# Patient Record
Sex: Female | Born: 1970 | Race: Black or African American | Hispanic: No | Marital: Married | Smoking: Current every day smoker
Health system: Southern US, Community
[De-identification: ages and names within clinical notes are randomized; demographics above are authoritative.]

## PROBLEM LIST (undated history)

## (undated) DIAGNOSIS — I10 Essential (primary) hypertension: Secondary | ICD-10-CM

---

## 2001-01-03 ENCOUNTER — Emergency Department (HOSPITAL_COMMUNITY): Admission: EM | Admit: 2001-01-03 | Discharge: 2001-01-03 | Payer: Self-pay | Admitting: Emergency Medicine

## 2001-02-07 ENCOUNTER — Emergency Department (HOSPITAL_COMMUNITY): Admission: EM | Admit: 2001-02-07 | Discharge: 2001-02-07 | Payer: Self-pay

## 2005-12-01 ENCOUNTER — Emergency Department: Payer: Self-pay | Admitting: Emergency Medicine

## 2006-05-14 ENCOUNTER — Emergency Department: Payer: Self-pay | Admitting: Emergency Medicine

## 2007-01-03 ENCOUNTER — Ambulatory Visit: Payer: Self-pay

## 2007-01-16 ENCOUNTER — Encounter: Payer: Self-pay | Admitting: Maternal & Fetal Medicine

## 2007-02-10 ENCOUNTER — Observation Stay: Payer: Self-pay

## 2007-02-12 ENCOUNTER — Inpatient Hospital Stay: Payer: Self-pay | Admitting: Obstetrics & Gynecology

## 2013-07-03 ENCOUNTER — Ambulatory Visit: Payer: Self-pay | Admitting: *Deleted

## 2013-07-09 ENCOUNTER — Ambulatory Visit: Payer: Self-pay | Admitting: Orthopedic Surgery

## 2014-07-04 ENCOUNTER — Ambulatory Visit: Payer: Self-pay | Admitting: Family Medicine

## 2015-04-10 ENCOUNTER — Emergency Department
Admission: EM | Admit: 2015-04-10 | Discharge: 2015-04-11 | Disposition: A | Discharge status: Home / Self Care | Payer: PRIVATE HEALTH INSURANCE | Attending: Student | Admitting: Student

## 2015-04-10 ENCOUNTER — Emergency Department: Payer: PRIVATE HEALTH INSURANCE

## 2015-04-10 ENCOUNTER — Encounter: Payer: Self-pay | Admitting: *Deleted

## 2015-04-10 DIAGNOSIS — S40011A Contusion of right shoulder, initial encounter: Secondary | ICD-10-CM

## 2015-04-10 DIAGNOSIS — Y92410 Unspecified street and highway as the place of occurrence of the external cause: Secondary | ICD-10-CM | POA: Insufficient documentation

## 2015-04-10 DIAGNOSIS — Z720 Tobacco use: Secondary | ICD-10-CM | POA: Insufficient documentation

## 2015-04-10 DIAGNOSIS — Y9389 Activity, other specified: Secondary | ICD-10-CM | POA: Diagnosis not present

## 2015-04-10 DIAGNOSIS — Y998 Other external cause status: Secondary | ICD-10-CM | POA: Diagnosis not present

## 2015-04-10 DIAGNOSIS — S4991XA Unspecified injury of right shoulder and upper arm, initial encounter: Secondary | ICD-10-CM | POA: Diagnosis present

## 2015-04-10 MED ORDER — HYDROCODONE-ACETAMINOPHEN 5-325 MG PO TABS: 1.0000 | ORAL_TABLET | ORAL | Status: DC | PRN

## 2015-04-10 MED ORDER — HYDROCODONE-ACETAMINOPHEN 5-325 MG PO TABS
1.0000 | ORAL_TABLET | Freq: Once | ORAL | Status: AC
  Administered 2015-04-11: 1 via ORAL
  Filled 2015-04-10: qty 1
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
## 2015-04-10 NOTE — Discharge Instructions (Signed)
Contusion °A contusion is a deep bruise. Contusions happen when an injury causes bleeding under the skin. Signs of bruising include pain, puffiness (swelling), and discolored skin. The contusion may turn blue, purple, or yellow. °HOME CARE  °· Put ice on the injured area. °¨ Put ice in a plastic bag. °¨ Place a towel between your skin and the bag. °¨ Leave the ice on for 15-20 minutes, 03-04 times a day. °· Only take medicine as told by your doctor. °· Rest the injured area. °· If possible, raise (elevate) the injured area to lessen puffiness. °GET HELP RIGHT AWAY IF:  °· You have more bruising or puffiness. °· You have pain that is getting worse. °· Your puffiness or pain is not helped by medicine. °MAKE SURE YOU:  °· Understand these instructions. °· Will watch your condition. °· Will get help right away if you are not doing well or get worse. °Document Released: 12/29/2007 Document Revised: 10/04/2011 Document Reviewed: 05/17/2011 °Exit[employer]® Patient Information ©2015 [employer]. This information is not intended to replace advice given to you by your health care provider. Make sure you discuss any questions you have with your health care provider. ° °
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
## 2015-04-11 DIAGNOSIS — S40011A Contusion of right shoulder, initial encounter: Secondary | ICD-10-CM | POA: Diagnosis not present

## 2016-05-05 ENCOUNTER — Other Ambulatory Visit: Payer: Self-pay | Admitting: Physician Assistant

## 2016-05-05 DIAGNOSIS — Z1231 Encounter for screening mammogram for malignant neoplasm of breast: Secondary | ICD-10-CM

## 2016-05-07 ENCOUNTER — Ambulatory Visit
Admission: RE | Admit: 2016-05-07 | Discharge: 2016-05-07 | Disposition: A | Discharge status: Home / Self Care | Payer: PRIVATE HEALTH INSURANCE | Source: Ambulatory Visit | Attending: Physician Assistant | Admitting: Physician Assistant

## 2016-05-07 DIAGNOSIS — Z1231 Encounter for screening mammogram for malignant neoplasm of breast: Secondary | ICD-10-CM | POA: Diagnosis not present

## 2017-05-08 ENCOUNTER — Other Ambulatory Visit: Payer: Self-pay

## 2017-05-08 DIAGNOSIS — Z1231 Encounter for screening mammogram for malignant neoplasm of breast: Secondary | ICD-10-CM

## 2017-05-11 ENCOUNTER — Ambulatory Visit
Admission: RE | Admit: 2017-05-11 | Discharge: 2017-05-11 | Disposition: A | Discharge status: Home / Self Care | Payer: PRIVATE HEALTH INSURANCE | Source: Ambulatory Visit | Attending: Physician Assistant | Admitting: Physician Assistant

## 2017-05-11 DIAGNOSIS — R928 Other abnormal and inconclusive findings on diagnostic imaging of breast: Secondary | ICD-10-CM | POA: Insufficient documentation

## 2017-05-11 DIAGNOSIS — Z1231 Encounter for screening mammogram for malignant neoplasm of breast: Secondary | ICD-10-CM | POA: Diagnosis present

## 2017-05-13 ENCOUNTER — Other Ambulatory Visit: Payer: Self-pay | Admitting: Physician Assistant

## 2017-05-13 DIAGNOSIS — N6489 Other specified disorders of breast: Secondary | ICD-10-CM

## 2017-05-13 DIAGNOSIS — R928 Other abnormal and inconclusive findings on diagnostic imaging of breast: Secondary | ICD-10-CM

## 2017-05-23 ENCOUNTER — Ambulatory Visit
Admission: RE | Admit: 2017-05-23 | Discharge: 2017-05-23 | Disposition: A | Discharge status: Home / Self Care | Payer: PRIVATE HEALTH INSURANCE | Source: Ambulatory Visit | Attending: Physician Assistant | Admitting: Physician Assistant

## 2017-05-23 DIAGNOSIS — N6489 Other specified disorders of breast: Secondary | ICD-10-CM | POA: Diagnosis not present

## 2017-05-23 DIAGNOSIS — R928 Other abnormal and inconclusive findings on diagnostic imaging of breast: Secondary | ICD-10-CM

## 2018-05-11 ENCOUNTER — Other Ambulatory Visit: Payer: Self-pay | Admitting: Physician Assistant

## 2018-05-11 DIAGNOSIS — Z1231 Encounter for screening mammogram for malignant neoplasm of breast: Secondary | ICD-10-CM

## 2018-05-16 ENCOUNTER — Ambulatory Visit: Payer: PRIVATE HEALTH INSURANCE | Attending: Physician Assistant

## 2019-05-01 ENCOUNTER — Ambulatory Visit
Admission: RE | Admit: 2019-05-01 | Discharge: 2019-05-01 | Disposition: A | Discharge status: Home / Self Care | Payer: PRIVATE HEALTH INSURANCE | Source: Ambulatory Visit | Attending: Physician Assistant | Admitting: Physician Assistant

## 2019-05-01 DIAGNOSIS — Z1231 Encounter for screening mammogram for malignant neoplasm of breast: Secondary | ICD-10-CM | POA: Insufficient documentation

## 2019-10-12 ENCOUNTER — Ambulatory Visit: Payer: PRIVATE HEALTH INSURANCE | Attending: Internal Medicine

## 2019-10-12 DIAGNOSIS — Z23 Encounter for immunization: Secondary | ICD-10-CM

## 2019-10-12 NOTE — Progress Notes (Signed)
   Covid-19 Vaccination Clinic  Name:  Kristina Salas    MRN: [id number] DOB: 1971-06-16  10/12/2019  [name] was observed post Covid-19 immunization for 15 minutes without incident. She was provided with Vaccine Information Sheet and instruction to access the V-Safe system.   [name] was instructed to call 911 with any severe reactions post vaccine: Marland Kitchen Difficulty breathing  . Swelling of face and throat  . A fast heartbeat  . A bad rash all over body  . Dizziness and weakness   Immunizations Administered    Name Date Dose VIS Date Route   Pfizer COVID-19 Vaccine 10/12/2019 11:01 AM 0.3 mL 07/06/2019 Intramuscular   Manufacturer: [employer], [employer]   Lot: [id number]   NDC: [address]-4

## 2019-11-06 ENCOUNTER — Ambulatory Visit: Payer: PRIVATE HEALTH INSURANCE | Attending: Internal Medicine

## 2019-11-06 DIAGNOSIS — Z23 Encounter for immunization: Secondary | ICD-10-CM

## 2019-11-06 NOTE — Progress Notes (Signed)
   Covid-19 Vaccination Clinic  Name:  ZARYA LASSEIGNE    MRN: [id number] DOB: 1970/09/04  11/06/2019  Ms. Inlow was observed post Covid-19 immunization for 15 minutes without incident. She was provided with Vaccine Information Sheet and instruction to access the V-Safe system.   Ms. Crisanti was instructed to call 911 with any severe reactions post vaccine: Marland Kitchen Difficulty breathing  . Swelling of face and throat  . A fast heartbeat  . A bad rash all over body  . Dizziness and weakness   Immunizations Administered    Name Date Dose VIS Date Route   Pfizer COVID-19 Vaccine 11/06/2019  2:12 PM 0.3 mL 07/06/2019 Intramuscular   Manufacturer: [employer], [employer]   Lot: [id number]   NDC: [address]-7

## 2020-01-21 ENCOUNTER — Emergency Department: Payer: BC Managed Care – PPO

## 2020-01-21 ENCOUNTER — Encounter: Payer: Self-pay | Admitting: Emergency Medicine

## 2020-01-21 ENCOUNTER — Other Ambulatory Visit: Payer: Self-pay

## 2020-01-21 ENCOUNTER — Emergency Department
Admission: EM | Admit: 2020-01-21 | Discharge: 2020-01-21 | Disposition: A | Discharge status: Home / Self Care | Payer: BC Managed Care – PPO | Attending: Emergency Medicine | Admitting: Emergency Medicine

## 2020-01-21 DIAGNOSIS — M7918 Myalgia, other site: Secondary | ICD-10-CM

## 2020-01-21 DIAGNOSIS — F17200 Nicotine dependence, unspecified, uncomplicated: Secondary | ICD-10-CM | POA: Insufficient documentation

## 2020-01-21 DIAGNOSIS — R52 Pain, unspecified: Secondary | ICD-10-CM

## 2020-01-21 DIAGNOSIS — S40812A Abrasion of left upper arm, initial encounter: Secondary | ICD-10-CM | POA: Diagnosis not present

## 2020-01-21 DIAGNOSIS — S0081XA Abrasion of other part of head, initial encounter: Secondary | ICD-10-CM | POA: Diagnosis not present

## 2020-01-21 DIAGNOSIS — T148XXA Other injury of unspecified body region, initial encounter: Secondary | ICD-10-CM

## 2020-01-21 DIAGNOSIS — Y999 Unspecified external cause status: Secondary | ICD-10-CM | POA: Diagnosis not present

## 2020-01-21 DIAGNOSIS — Y939 Activity, unspecified: Secondary | ICD-10-CM | POA: Diagnosis not present

## 2020-01-21 DIAGNOSIS — Y92410 Unspecified street and highway as the place of occurrence of the external cause: Secondary | ICD-10-CM | POA: Insufficient documentation

## 2020-01-21 DIAGNOSIS — S80812A Abrasion, left lower leg, initial encounter: Secondary | ICD-10-CM | POA: Insufficient documentation

## 2020-01-21 DIAGNOSIS — M791 Myalgia, unspecified site: Secondary | ICD-10-CM | POA: Insufficient documentation

## 2020-01-21 DIAGNOSIS — I10 Essential (primary) hypertension: Secondary | ICD-10-CM | POA: Diagnosis not present

## 2020-01-21 DIAGNOSIS — S0990XA Unspecified injury of head, initial encounter: Secondary | ICD-10-CM | POA: Diagnosis present

## 2020-01-21 HISTORY — DX: Essential (primary) hypertension: I10

## 2020-01-21 LAB — POCT PREGNANCY, URINE: Preg Test, Ur: NEGATIVE

## 2020-01-21 MED ORDER — IBUPROFEN 800 MG PO TABS: 800.0000 mg | ORAL_TABLET | Freq: Three times a day (TID) | ORAL | 0 refills | Status: AC | PRN

## 2020-01-21 MED ORDER — OXYCODONE-ACETAMINOPHEN 5-325 MG PO TABS
1.0000 | ORAL_TABLET | ORAL | Status: DC | PRN
  Administered 2020-01-21: 1 via ORAL
  Filled 2020-01-21: qty 1

## 2020-01-21 MED ORDER — CYCLOBENZAPRINE HCL 10 MG PO TABS: 10.0000 mg | ORAL_TABLET | Freq: Three times a day (TID) | ORAL | 0 refills | Status: AC | PRN

## 2020-01-21 MED ORDER — NEOSPORIN PLUS PAIN RELIEF MS 3.5-10000-10 EX CREA
TOPICAL_CREAM | Freq: Two times a day (BID) | CUTANEOUS | 0 refills | Status: AC
Start: 2020-01-21 — End: ?

## 2020-01-21 MED ORDER — OXYCODONE-ACETAMINOPHEN 5-325 MG PO TABS: 1.0000 | ORAL_TABLET | Freq: Four times a day (QID) | ORAL | 0 refills | Status: AC | PRN

## 2020-01-21 MED ORDER — BACITRACIN-NEOMYCIN-POLYMYXIN 400-5-5000 EX OINT
TOPICAL_OINTMENT | Freq: Once | CUTANEOUS | Status: DC
  Filled 2020-01-21: qty 1

## 2020-01-21 MED ORDER — IBUPROFEN 600 MG PO TABS
600.0000 mg | ORAL_TABLET | Freq: Once | ORAL | Status: AC
  Administered 2020-01-21: 600 mg via ORAL
  Filled 2020-01-21: qty 1

## 2020-01-21 MED ORDER — CYCLOBENZAPRINE HCL 10 MG PO TABS
10.0000 mg | ORAL_TABLET | Freq: Once | ORAL | Status: AC
  Administered 2020-01-21: 10 mg via ORAL
  Filled 2020-01-21: qty 1
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
## 2020-01-21 NOTE — Discharge Instructions (Signed)
Follow discharge care instruction take medication as directed. °
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
## 2020-01-29 ENCOUNTER — Other Ambulatory Visit: Payer: Self-pay | Admitting: Physician Assistant

## 2020-04-28 ENCOUNTER — Other Ambulatory Visit: Payer: Self-pay | Admitting: Family Medicine

## 2020-04-28 DIAGNOSIS — Z1231 Encounter for screening mammogram for malignant neoplasm of breast: Secondary | ICD-10-CM

## 2020-05-01 ENCOUNTER — Ambulatory Visit
Admission: RE | Admit: 2020-05-01 | Discharge: 2020-05-01 | Disposition: A | Discharge status: Home / Self Care | Payer: BC Managed Care – PPO | Source: Ambulatory Visit | Attending: Family Medicine | Admitting: Family Medicine

## 2020-05-01 DIAGNOSIS — Z1231 Encounter for screening mammogram for malignant neoplasm of breast: Secondary | ICD-10-CM | POA: Diagnosis present

## 2021-05-04 ENCOUNTER — Other Ambulatory Visit: Payer: Self-pay | Admitting: Family Medicine

## 2021-05-04 ENCOUNTER — Ambulatory Visit
Admission: RE | Admit: 2021-05-04 | Discharge: 2021-05-04 | Disposition: A | Discharge status: Home / Self Care | Payer: BC Managed Care – PPO | Source: Ambulatory Visit | Attending: Family Medicine | Admitting: Family Medicine

## 2021-05-04 ENCOUNTER — Other Ambulatory Visit: Payer: Self-pay

## 2021-05-04 DIAGNOSIS — Z1231 Encounter for screening mammogram for malignant neoplasm of breast: Secondary | ICD-10-CM

## 2022-05-24 ENCOUNTER — Other Ambulatory Visit: Payer: Self-pay | Admitting: Family Medicine

## 2022-05-24 DIAGNOSIS — Z1231 Encounter for screening mammogram for malignant neoplasm of breast: Secondary | ICD-10-CM

## 2022-06-07 ENCOUNTER — Ambulatory Visit
Admission: RE | Admit: 2022-06-07 | Discharge: 2022-06-07 | Disposition: A | Discharge status: Home / Self Care | Payer: BC Managed Care – PPO | Source: Ambulatory Visit | Attending: Family Medicine | Admitting: Family Medicine

## 2022-06-07 DIAGNOSIS — Z1231 Encounter for screening mammogram for malignant neoplasm of breast: Secondary | ICD-10-CM

## 2023-02-18 IMAGING — MG MM DIGITAL SCREENING BILAT W/ TOMO AND CAD
8 series · 8 of 24 positions shown · non-contrast
Comparison: Previous exam(s).

CLINICAL DATA: Screening.

EXAM:
DIGITAL SCREENING BILATERAL MAMMOGRAM WITH TOMOSYNTHESIS AND CAD
TECHNIQUE: Bilateral screening digital craniocaudal and mediolateral oblique
mammograms were obtained. Bilateral screening digital breast
tomosynthesis was performed. The images were evaluated with
computer-aided detection.

[L MLO synth-2D]
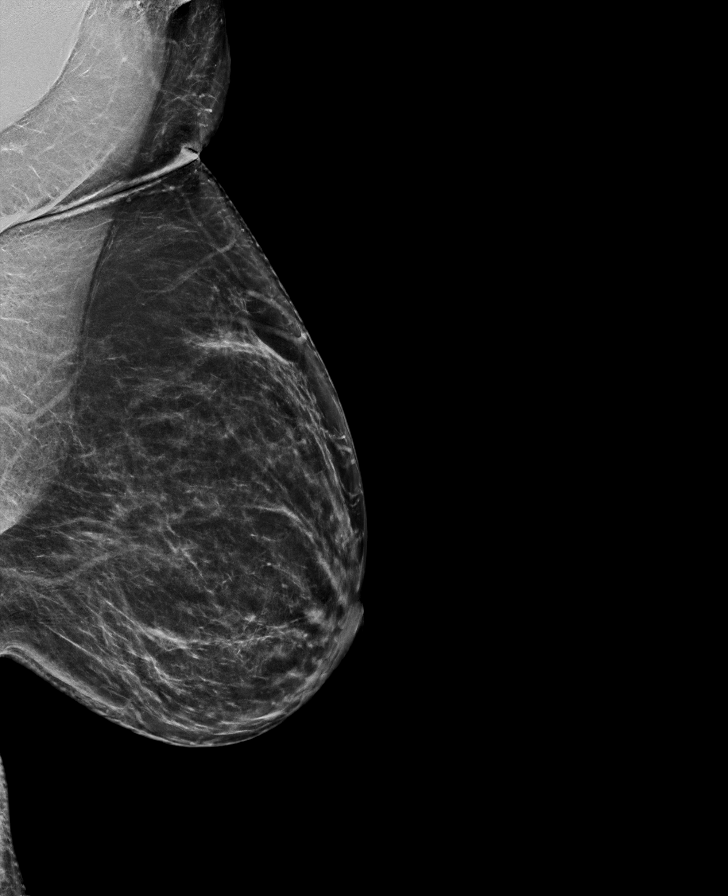

[R CC synth-2D]
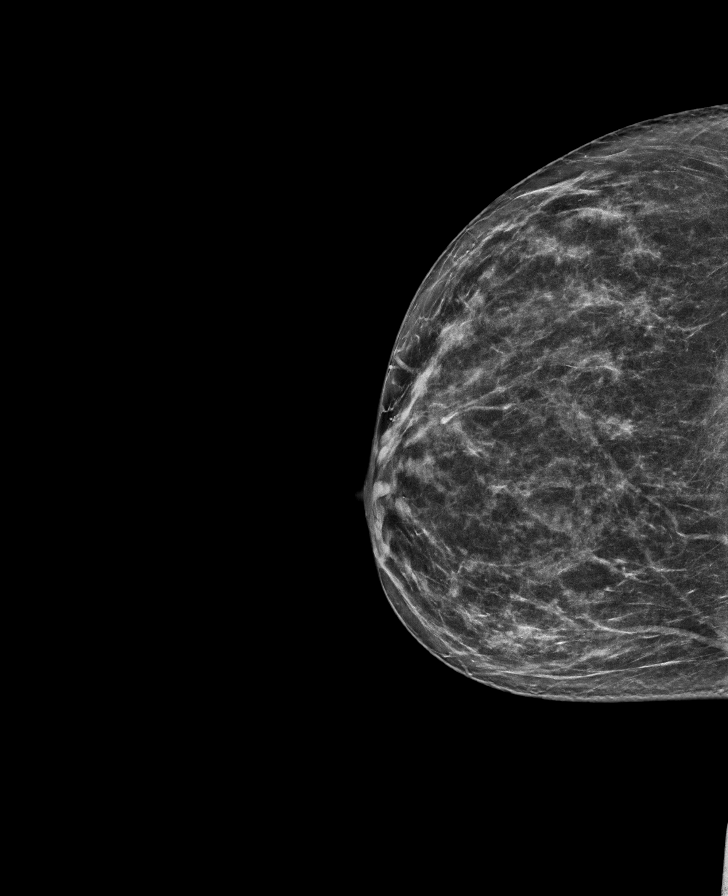

[R MLO synth-2D]
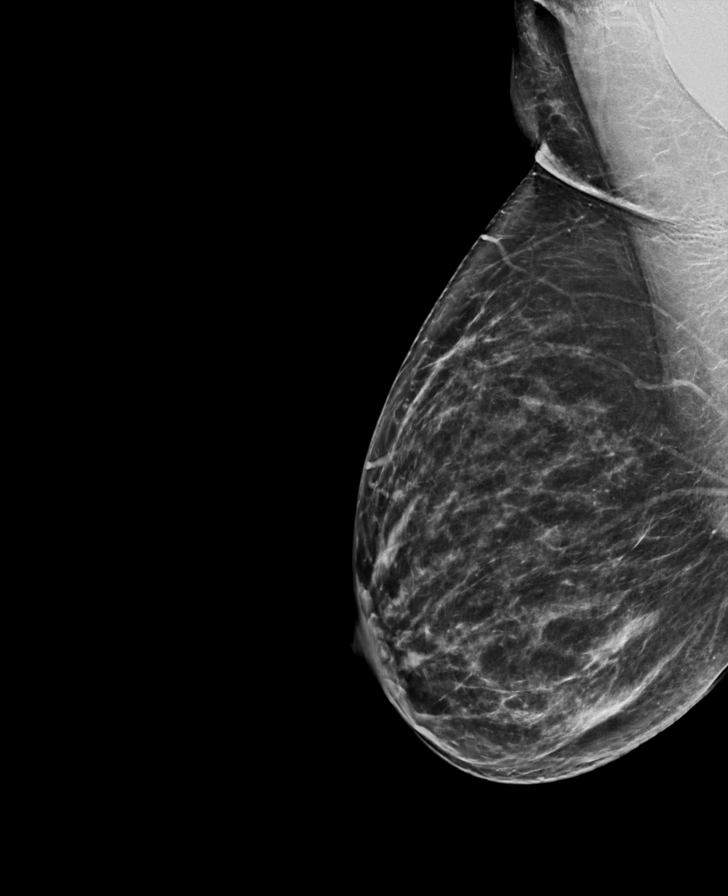

[L CC synth-2D]
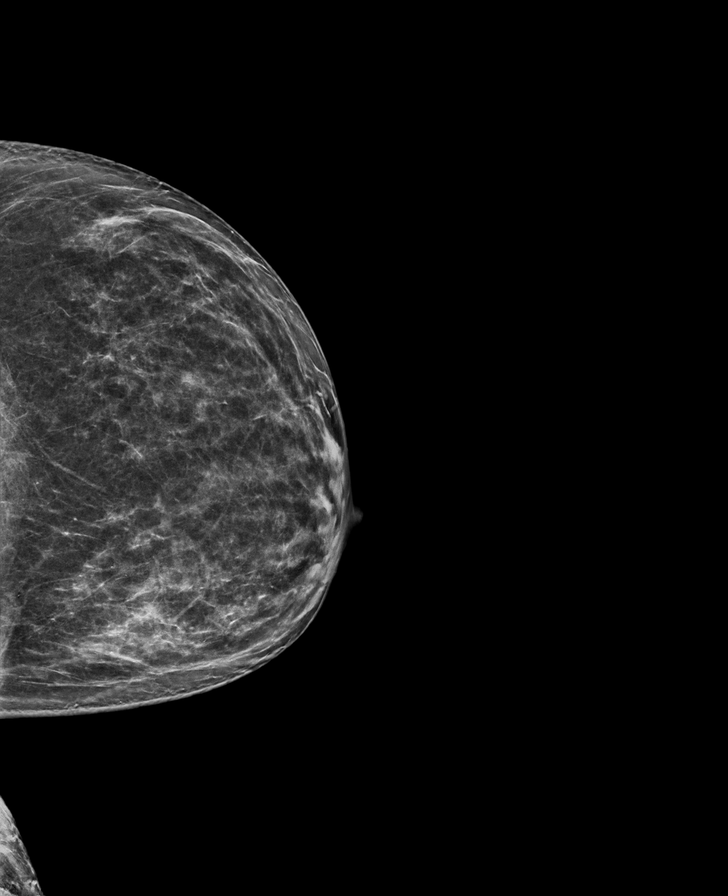

[L MLO tomo · tomo slice 39/77.0]
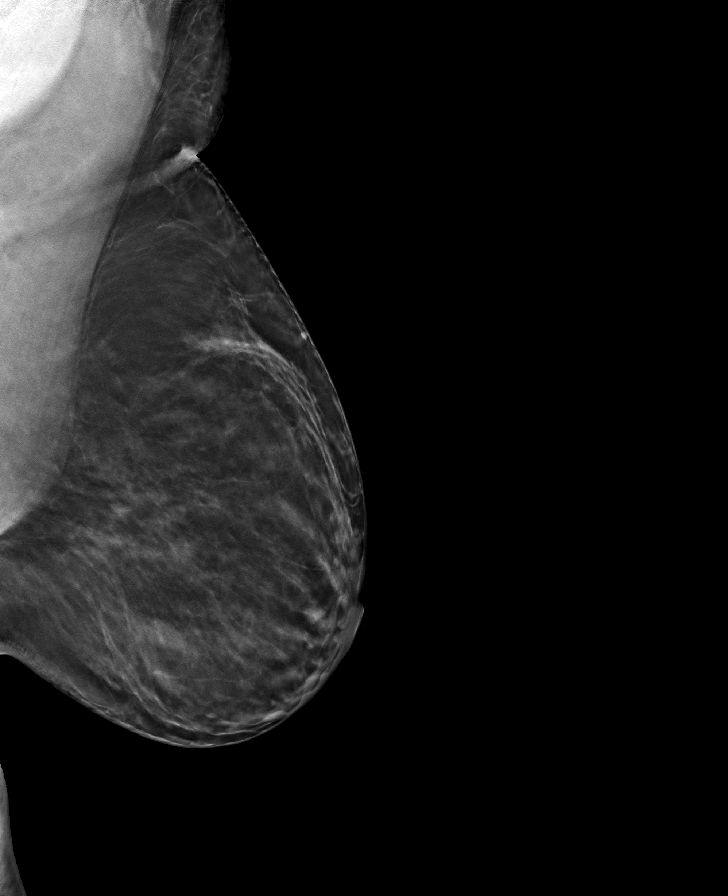

[R CC tomo · tomo slice 36/71.0]
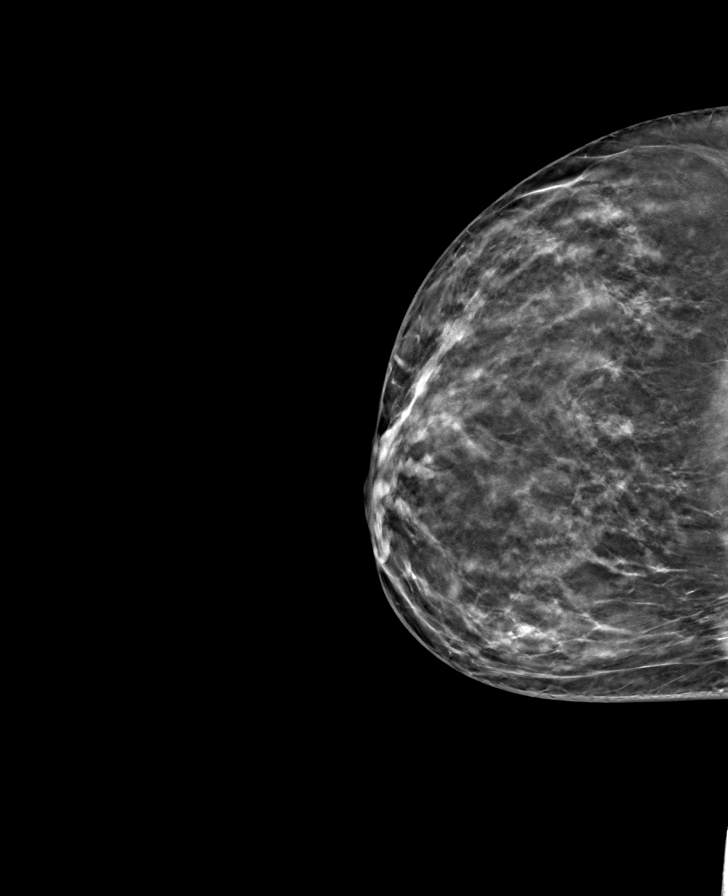

[L CC tomo · tomo slice 35/70.0]
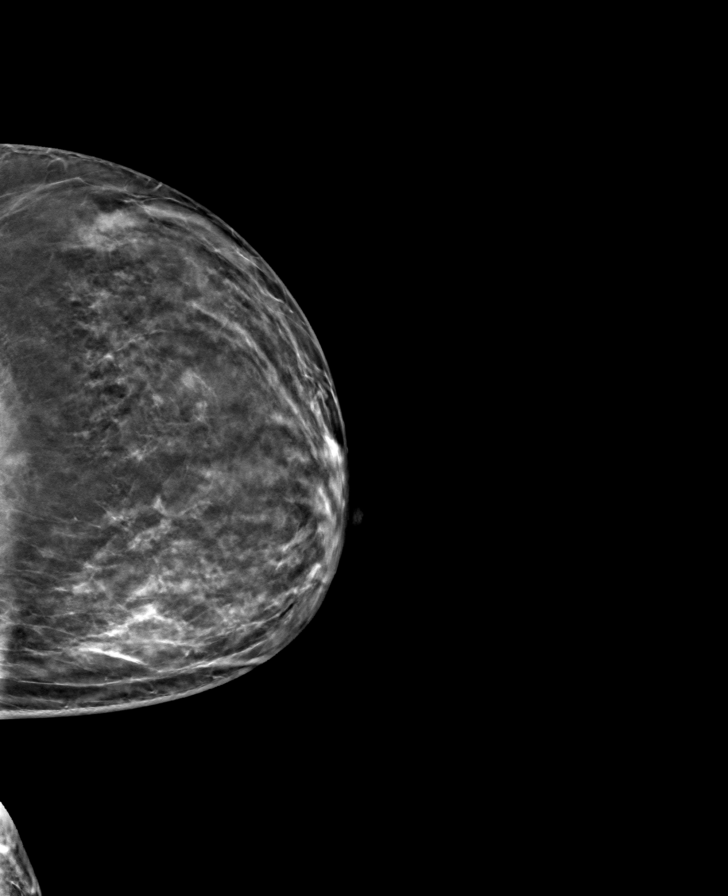

[R MLO tomo · tomo slice 41/80.0]
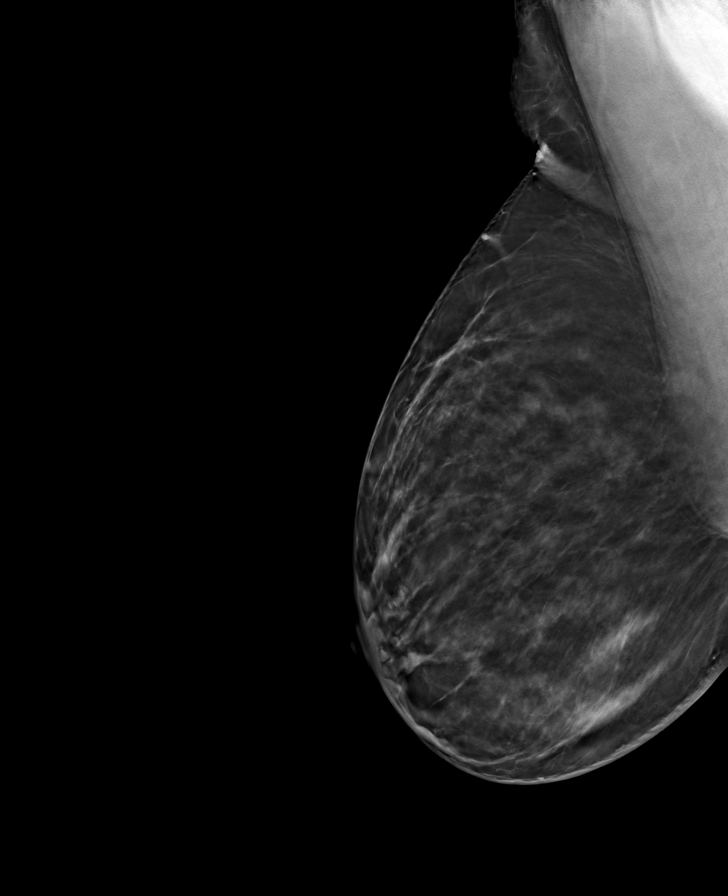

[8 of 24 positions shown; findings below may reference images not displayed]

ACR Breast Density Category b: There are scattered areas of
fibroglandular density.
FINDINGS: There are no findings suspicious for malignancy.
IMPRESSION: No mammographic evidence of malignancy. A result letter of this
screening mammogram will be mailed directly to the patient.

RECOMMENDATION:
Screening mammogram in one year. (Code:51-O-LD2)

BI-RADS CATEGORY  1: Negative.

## 2023-06-10 ENCOUNTER — Other Ambulatory Visit: Payer: Self-pay | Admitting: Family Medicine

## 2023-06-10 DIAGNOSIS — Z1231 Encounter for screening mammogram for malignant neoplasm of breast: Secondary | ICD-10-CM

## 2023-06-22 ENCOUNTER — Ambulatory Visit
Admission: RE | Admit: 2023-06-22 | Discharge: 2023-06-22 | Disposition: A | Discharge status: Home / Self Care | Payer: BC Managed Care – PPO | Source: Ambulatory Visit | Attending: Family Medicine | Admitting: Family Medicine

## 2023-06-22 DIAGNOSIS — Z1231 Encounter for screening mammogram for malignant neoplasm of breast: Secondary | ICD-10-CM

## 2023-12-29 ENCOUNTER — Emergency Department
Admission: EM | Admit: 2023-12-29 | Discharge: 2023-12-29 | Discharge status: Against Medical Advice | Attending: Emergency Medicine | Admitting: Emergency Medicine

## 2023-12-29 DIAGNOSIS — Z5321 Procedure and treatment not carried out due to patient leaving prior to being seen by health care provider: Secondary | ICD-10-CM | POA: Insufficient documentation

## 2023-12-29 DIAGNOSIS — Y990 Civilian activity done for income or pay: Secondary | ICD-10-CM | POA: Diagnosis not present

## 2023-12-29 DIAGNOSIS — S6990XA Unspecified injury of unspecified wrist, hand and finger(s), initial encounter: Secondary | ICD-10-CM | POA: Insufficient documentation

## 2023-12-29 DIAGNOSIS — W458XXA Other foreign body or object entering through skin, initial encounter: Secondary | ICD-10-CM | POA: Diagnosis not present

## 2023-12-29 NOTE — ED Triage Notes (Signed)
 Pt presents to the ED via POV from home for 2nd finger injury. Pt reports getting a hook stuck in it at work today. Unknown last tetanus.   Pt was able to get the needle out during triage with hook intact. States that she will stay for a tetanus shot.

## 2023-12-29 NOTE — ED Notes (Signed)
 Per Jacqlyn Matas RN, pt decided to no longer stay and left ED.

## 2024-06-05 ENCOUNTER — Other Ambulatory Visit: Payer: Self-pay | Admitting: Family Medicine

## 2024-06-05 ENCOUNTER — Encounter: Payer: Self-pay | Admitting: Obstetrics & Gynecology

## 2024-06-05 DIAGNOSIS — Z1231 Encounter for screening mammogram for malignant neoplasm of breast: Secondary | ICD-10-CM

## 2024-06-26 ENCOUNTER — Ambulatory Visit
Admission: RE | Admit: 2024-06-26 | Discharge: 2024-06-26 | Disposition: A | Source: Ambulatory Visit | Attending: Family Medicine | Admitting: Family Medicine

## 2024-06-26 DIAGNOSIS — Z1231 Encounter for screening mammogram for malignant neoplasm of breast: Secondary | ICD-10-CM | POA: Diagnosis present
